# Patient Record
Sex: Male | Born: 1970 | Race: White | Hispanic: No | Marital: Married | Smoking: Never smoker
Health system: Southern US, Community
[De-identification: ages and names within clinical notes are randomized; demographics above are authoritative.]

## PROBLEM LIST (undated history)

## (undated) DIAGNOSIS — M1990 Unspecified osteoarthritis, unspecified site: Secondary | ICD-10-CM

## (undated) DIAGNOSIS — Z8701 Personal history of pneumonia (recurrent): Secondary | ICD-10-CM

## (undated) DIAGNOSIS — N289 Disorder of kidney and ureter, unspecified: Secondary | ICD-10-CM

## (undated) DIAGNOSIS — K219 Gastro-esophageal reflux disease without esophagitis: Secondary | ICD-10-CM

## (undated) HISTORY — PX: ADENOIDECTOMY: SHX5191

## (undated) HISTORY — PX: FOOT SURGERY: SHX648

---

## 2001-10-05 ENCOUNTER — Encounter: Payer: Self-pay | Admitting: Family Medicine

## 2001-10-05 ENCOUNTER — Ambulatory Visit (HOSPITAL_COMMUNITY): Admission: RE | Admit: 2001-10-05 | Discharge: 2001-10-05 | Payer: Self-pay | Admitting: Family Medicine

## 2007-02-11 ENCOUNTER — Ambulatory Visit (HOSPITAL_COMMUNITY): Admission: RE | Admit: 2007-02-11 | Discharge: 2007-02-11 | Payer: Self-pay | Admitting: Family Medicine

## 2008-05-15 ENCOUNTER — Ambulatory Visit (HOSPITAL_COMMUNITY): Admission: RE | Admit: 2008-05-15 | Discharge: 2008-05-15 | Payer: Self-pay | Admitting: Family Medicine

## 2009-12-29 ENCOUNTER — Ambulatory Visit: Payer: Self-pay | Admitting: Internal Medicine

## 2011-07-15 ENCOUNTER — Emergency Department (HOSPITAL_COMMUNITY): Payer: 59

## 2011-07-15 ENCOUNTER — Emergency Department (HOSPITAL_COMMUNITY)
Admission: EM | Admit: 2011-07-15 | Discharge: 2011-07-15 | Disposition: A | Discharge status: Home / Self Care | Payer: 59 | Attending: Emergency Medicine | Admitting: Emergency Medicine

## 2011-07-15 DIAGNOSIS — N200 Calculus of kidney: Secondary | ICD-10-CM | POA: Insufficient documentation

## 2011-07-15 DIAGNOSIS — N23 Unspecified renal colic: Secondary | ICD-10-CM

## 2011-07-15 LAB — URINE MICROSCOPIC-ADD ON

## 2011-07-15 LAB — URINALYSIS, ROUTINE W REFLEX MICROSCOPIC
Bilirubin Urine: NEGATIVE
Glucose, UA: NEGATIVE mg/dL
Ketones, ur: NEGATIVE mg/dL
Leukocytes, UA: NEGATIVE
Nitrite: NEGATIVE
Specific Gravity, Urine: >1.03 — ABNORMAL HIGH (ref 1.005–1.030)
Urobilinogen, UA: 0.2 mg/dL (ref 0.0–1.0)
pH: 5.5 (ref 5.0–8.0)

## 2011-07-15 MED ORDER — IBUPROFEN 800 MG PO TABS: 800.0000 mg | ORAL_TABLET | Freq: Three times a day (TID) | ORAL | Status: AC

## 2011-07-15 MED ORDER — OXYCODONE-ACETAMINOPHEN 5-325 MG PO TABS: ORAL_TABLET | ORAL | Status: DC

## 2011-07-15 MED ORDER — SODIUM CHLORIDE 0.9 % IV SOLN: Freq: Once | INTRAVENOUS | Status: DC

## 2011-07-15 NOTE — ED Notes (Signed)
Pt reports rt flank pain radiating around to his rt abd that began yesterday.  Pt denies any urinary symptoms but states that the pain has worsened.  Pt also reports some nausea and diaphoresis that began today.

## 2011-07-15 NOTE — ED Notes (Signed)
Pt given urine strainer and instructions to strain all urine.

## 2011-07-15 NOTE — ED Notes (Signed)
Pt c/o right flank pain off and on since yesterday. C/o nausea and "breaking out in a sweat" this am x 1. No c/o pain or nausea at this time. Family at bedside.

## 2011-07-15 NOTE — ED Provider Notes (Signed)
History     CSN: [id number] Arrival date & time: 07/15/2011  2:31 PM  Chief Complaint  Patient presents with  . Flank Pain   Patient is a 40 y.o. male presenting with flank pain. The history is provided by the patient.  Flank Pain This is a new problem. The current episode started yesterday. The problem has been gradually worsening. Associated symptoms include abdominal pain and nausea. Pertinent negatives include no arthralgias, change in bowel habit, chest pain, chills, coughing, fever, neck pain, rash, swollen glands or urinary symptoms. The symptoms are aggravated by nothing. He has tried NSAIDs for the symptoms. The treatment provided no relief.    History reviewed. No pertinent past medical history.  Past Surgical History  Procedure Date  . Foot surgery     No family history on file.  History  Substance Use Topics  . Smoking status: Never Smoker   . Smokeless tobacco: Not on file  . Alcohol Use: No      Review of Systems  Constitutional: Negative for fever, chills and activity change.       All ROS Neg except as noted in HPI  HENT: Negative for nosebleeds and neck pain.   Eyes: Negative for photophobia and discharge.  Respiratory: Negative for cough, shortness of breath and wheezing.   Cardiovascular: Negative for chest pain and palpitations.  Gastrointestinal: Positive for nausea and abdominal pain. Negative for blood in stool and change in bowel habit.  Genitourinary: Positive for flank pain. Negative for dysuria, frequency and hematuria.  Musculoskeletal: Negative for back pain and arthralgias.  Skin: Negative.  Negative for rash.  Neurological: Negative for dizziness, seizures and speech difficulty.  Psychiatric/Behavioral: Negative for hallucinations and confusion.    Physical Exam  BP 139/89  Pulse 86  Temp(Src) 98.6 F (37 C) (Oral)  Resp 18  Ht 6' (1.829 m)  Wt 203 lb (92.08 kg)  BMI 27.53 kg/m2  SpO2 98%  Physical Exam  Nursing note and vitals  reviewed. Constitutional: He is oriented to person, place, and time. He appears well-developed and well-nourished.  Non-toxic appearance.  HENT:  Head: Normocephalic.  Right Ear: Tympanic membrane and external ear normal.  Left Ear: Tympanic membrane and external ear normal.  Eyes: EOM and lids are normal. Pupils are equal, round, and reactive to light.  Neck: Normal range of motion. Neck supple. Carotid bruit is not present.  Cardiovascular: Normal rate, regular rhythm, normal heart sounds, intact distal pulses and normal pulses.   Pulmonary/Chest: Breath sounds normal. No respiratory distress.  Abdominal: Soft. Bowel sounds are normal. There is tenderness. There is no guarding.       Mild right CVAT  Musculoskeletal: Normal range of motion.  Lymphadenopathy:       Head (right side): No submandibular adenopathy present.       Head (left side): No submandibular adenopathy present.    He has no cervical adenopathy.  Neurological: He is alert and oriented to person, place, and time. He has normal strength. No cranial nerve deficit or sensory deficit.  Skin: Skin is warm and dry.  Psychiatric: He has a normal mood and affect. His speech is normal.    ED Course  Procedures  MDM Suspect renal colic with kidney stone. R/O UTI, atypical appendicitis I have reviewed nursing notes, vital signs, and all appropriate lab and imaging results for this patient. Results for orders placed during the hospital encounter of 07/15/11  URINALYSIS, ROUTINE W REFLEX MICROSCOPIC      Component  Value Range   Color, Urine YELLOW  YELLOW    APPearance CLEAR  CLEAR    Specific Gravity, Urine >1.030 (*) 1.005 - 1.030    pH 5.5  5.0 - 8.0    Glucose, UA NEGATIVE  NEGATIVE (mg/dL)   Hgb urine dipstick LARGE (*) NEGATIVE    Bilirubin Urine NEGATIVE  NEGATIVE    Ketones, ur NEGATIVE  NEGATIVE (mg/dL)   Protein, ur TRACE (*) NEGATIVE (mg/dL)   Urobilinogen, UA 0.2  0.0 - 1.0 (mg/dL)   Nitrite NEGATIVE   NEGATIVE    Leukocytes, UA NEGATIVE  NEGATIVE   URINE MICROSCOPIC-ADD ON      Component Value Range   Squamous Epithelial / LPF FEW (*) RARE    WBC, UA 3-6  <3 (WBC/hpf)   RBC / HPF TOO NUMEROUS TO COUNT  <3 (RBC/hpf)   Bacteria, UA FEW (*) RARE    Urine-Other MUCOUS PRESENT     No results found.     Kathie Dike, PA 07/15/11 [address] [place], [place] 01/05/12 [phone]

## 2011-07-15 NOTE — Progress Notes (Signed)
Test results given to pt. Plan discussed.

## 2011-07-15 NOTE — Progress Notes (Signed)
  Medical screening examination/treatment/procedure(s) were performed by non-physician practitioner and as supervising physician I was immediately available for consultation/collaboration.     

## 2012-01-07 NOTE — ED Provider Notes (Signed)
Medical screening examination/treatment/procedure(s) were performed by non-physician practitioner and as supervising physician I was immediately available for consultation/collaboration.  Juliet Rude. Rubin Payor, MD 01/07/12 [phone]

## 2013-04-03 ENCOUNTER — Encounter (HOSPITAL_COMMUNITY): Payer: Self-pay | Admitting: Emergency Medicine

## 2013-04-03 ENCOUNTER — Emergency Department (HOSPITAL_COMMUNITY)
Admission: EM | Admit: 2013-04-03 | Discharge: 2013-04-03 | Disposition: A | Discharge status: Home / Self Care | Payer: 59 | Attending: Emergency Medicine | Admitting: Emergency Medicine

## 2013-04-03 ENCOUNTER — Emergency Department (HOSPITAL_COMMUNITY): Payer: 59

## 2013-04-03 DIAGNOSIS — R110 Nausea: Secondary | ICD-10-CM | POA: Insufficient documentation

## 2013-04-03 DIAGNOSIS — N201 Calculus of ureter: Secondary | ICD-10-CM | POA: Insufficient documentation

## 2013-04-03 DIAGNOSIS — Z79899 Other long term (current) drug therapy: Secondary | ICD-10-CM | POA: Insufficient documentation

## 2013-04-03 DIAGNOSIS — N23 Unspecified renal colic: Secondary | ICD-10-CM

## 2013-04-03 LAB — BASIC METABOLIC PANEL
Calcium: 9.1 mg/dL (ref 8.4–10.5)
Creatinine, Ser: 1.01 mg/dL (ref 0.50–1.35)
GFR calc Af Amer: >90 mL/min (ref >90)

## 2013-04-03 LAB — CBC WITH DIFFERENTIAL/PLATELET
Basophils Absolute: 0 10*3/uL (ref 0.0–0.1)
Basophils Relative: 0 % (ref 0–1)
Eosinophils Absolute: 0.2 10*3/uL (ref 0.0–0.7)
Eosinophils Relative: 2 % (ref 0–5)
HCT: 41.9 % (ref 39.0–52.0)
MCH: 30.3 pg (ref 26.0–34.0)
MCHC: 35.3 g/dL (ref 30.0–36.0)
MCV: 85.7 fL (ref 78.0–100.0)
Monocytes Absolute: 0.9 10*3/uL (ref 0.1–1.0)
Neutro Abs: 6.5 10*3/uL (ref 1.7–7.7)
RDW: 13.2 % (ref 11.5–15.5)

## 2013-04-03 MED ORDER — SODIUM CHLORIDE 0.9 % IV SOLN: 1000.0000 mL | INTRAVENOUS | Status: DC

## 2013-04-03 MED ORDER — OXYCODONE-ACETAMINOPHEN 5-325 MG PO TABS: 1.0000 | ORAL_TABLET | ORAL | Status: DC | PRN

## 2013-04-03 MED ORDER — KETOROLAC TROMETHAMINE 30 MG/ML IJ SOLN
30.0000 mg | Freq: Once | INTRAMUSCULAR | Status: AC
  Administered 2013-04-03: 30 mg via INTRAVENOUS
  Filled 2013-04-03: qty 1

## 2013-04-03 MED ORDER — TAMSULOSIN HCL 0.4 MG PO CAPS: 0.4000 mg | ORAL_CAPSULE | Freq: Every day | ORAL | Status: DC

## 2013-04-03 MED ORDER — NAPROXEN 500 MG PO TABS: 500.0000 mg | ORAL_TABLET | Freq: Two times a day (BID) | ORAL | Status: DC

## 2013-04-03 MED ORDER — ONDANSETRON HCL 4 MG/2ML IJ SOLN
4.0000 mg | Freq: Once | INTRAMUSCULAR | Status: AC
  Administered 2013-04-03: 4 mg via INTRAVENOUS
  Filled 2013-04-03: qty 2

## 2013-04-03 MED ORDER — HYDROMORPHONE HCL PF 1 MG/ML IJ SOLN
1.0000 mg | Freq: Once | INTRAMUSCULAR | Status: AC
  Administered 2013-04-03: 1 mg via INTRAVENOUS
  Filled 2013-04-03: qty 1

## 2013-04-03 MED ORDER — SODIUM CHLORIDE 0.9 % IV SOLN
1000.0000 mL | Freq: Once | INTRAVENOUS | Status: AC
  Administered 2013-04-03: 1000 mL via INTRAVENOUS

## 2013-04-03 NOTE — ED Notes (Signed)
Pt presents with left sided flank pain, states he believes he has a kidney stone. States he has had one in the past and this feels the same, also notes nausea and vomiting, actively vomiting on assessment.

## 2013-04-03 NOTE — ED Notes (Signed)
Patient c/o left flank pain since 2315; states having nausea.

## 2013-04-03 NOTE — ED Provider Notes (Signed)
History     CSN: [id number]  Arrival date & time 04/03/13  0031   First MD Initiated Contact with Patient 04/03/13 0107      Chief Complaint  Patient presents with  . Flank Pain    (Consider location/radiation/quality/duration/timing/severity/associated sxs/prior treatment) Patient is a 42 y.o. male presenting with flank pain. The history is provided by the patient.  Flank Pain  He had onset at 2315 of severe left flank pain with radiation to left lower abdomen. Pain is a burning and he rates it at 9/10. There is associated nausea without vomiting. He denies fever, chills, sweats. Pain is similar to what he has had with kidney stones in the past. Nothing makes it better nothing makes it worse. He is unable to find a comfortable position. He denies any urinary difficulty.  History reviewed. No pertinent past medical history.  Past Surgical History  Procedure Laterality Date  . Foot surgery      No family history on file.  History  Substance Use Topics  . Smoking status: Never Smoker   . Smokeless tobacco: Not on file  . Alcohol Use: No      Review of Systems  Genitourinary: Positive for flank pain.  All other systems reviewed and are negative.    Allergies  Review of patient's allergies indicates no known allergies.  Home Medications   Current Outpatient Rx  Name  Route  Sig  Dispense  Refill  . ibuprofen (ADVIL,MOTRIN) 200 MG tablet   Oral   Take 800 mg by mouth as needed. For pain          . omeprazole (PRILOSEC OTC) 20 MG tablet   Oral   Take 20 mg by mouth daily.           Marland Kitchen oxyCODONE-acetaminophen (PERCOCET) 5-325 MG per tablet      1 or 2 po q4h prn pain, take with food   25 tablet   0     BP 137/91  Pulse 106  Temp(Src) 98.5 F (36.9 C) (Oral)  Resp 20  Ht 6' (1.829 m)  Wt 210 lb (95.255 kg)  BMI 28.47 kg/m2  SpO2 98%  Physical Exam  Nursing note and vitals reviewed.  42 year old male, who is pacing the room trying to find a  comfortable position, but it is in no acute distress. Vital signs are significant for tachycardia with heart rate 106, and hypertension with blood pressure 137/91. Oxygen saturation is 98%, which is normal. Head is normocephalic and atraumatic. PERRLA, EOMI. Oropharynx is clear. Neck is nontender and supple without adenopathy or JVD. Back is nontender with moderate left CVA tenderness. Lungs are clear without rales, wheezes, or rhonchi. Chest is nontender. Heart has regular rate and rhythm without murmur. Abdomen is soft, flat, with mild tenderness in the left lower quadrant without rebound or guarding. There are no masses or hepatosplenomegaly and peristalsis is hypoactive. Extremities have no cyanosis or edema, full range of motion is present. Skin is pale warm and dry without rash. Neurologic: Mental status is normal, cranial nerves are intact, there are no motor or sensory deficits.  ED Course  Procedures (including critical care time)  Results for orders placed during the hospital encounter of 04/03/13  CBC WITH DIFFERENTIAL      Result Value Range   WBC 10.0  4.0 - 10.5 K/uL   RBC 4.89  4.22 - 5.81 MIL/uL   Hemoglobin 14.8  13.0 - 17.0 g/dL   HCT 41.9  39.0 - 52.0 %   MCV 85.7  78.0 - 100.0 fL   MCH 30.3  26.0 - 34.0 pg   MCHC 35.3  30.0 - 36.0 g/dL   RDW 16.1  09.6 - 04.5 %   Platelets 214  150 - 400 K/uL   Neutrophils Relative 65  43 - 77 %   Neutro Abs 6.5  1.7 - 7.7 K/uL   Lymphocytes Relative 25  12 - 46 %   Lymphs Abs 2.5  0.7 - 4.0 K/uL   Monocytes Relative 9  3 - 12 %   Monocytes Absolute 0.9  0.1 - 1.0 K/uL   Eosinophils Relative 2  0 - 5 %   Eosinophils Absolute 0.2  0.0 - 0.7 K/uL   Basophils Relative 0  0 - 1 %   Basophils Absolute 0.0  0.0 - 0.1 K/uL  BASIC METABOLIC PANEL      Result Value Range   Sodium 136  135 - 145 mEq/L   Potassium 3.6  3.5 - 5.1 mEq/L   Chloride 101  96 - 112 mEq/L   CO2 24  19 - 32 mEq/L   Glucose, Bld 124 (*) 70 - 99 mg/dL   BUN  14  6 - 23 mg/dL   Creatinine, Ser 4.09  0.50 - 1.35 mg/dL   Calcium 9.1  8.4 - 81.1 mg/dL   GFR calc non Af Amer >90  >90 mL/min   GFR calc Af Amer >90  >90 mL/min   Ct Abdomen Pelvis Wo Contrast  04/03/2013  *RADIOLOGY REPORT*  Clinical Data: Left flank pain.  Nausea and vomiting.  CT ABDOMEN AND PELVIS WITHOUT CONTRAST  Technique:  Multidetector CT imaging of the abdomen and pelvis was performed following the standard protocol without intravenous contrast.  Comparison: CT abdomen and pelvis 07/15/2011.  Findings: The lung bases are clear.  No pleural or pericardial effusion.  There is moderate left hydronephrosis due to a punctate stone measuring approximately 0.3 cm in the left ureter at the level of L3-4.  The patient has a punctate nonobstructing stone in the lower pole of the right kidney.  No left renal stones are seen.  The liver, gallbladder, adrenal glands, spleen and pancreas appear normal.  There is some colonic diverticulosis without diverticulitis.  The stomach, small bowel and appendix appear normal.  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  IMPRESSION:  1.  Moderate left hydronephrosis due to a 0.3 cm mid left ureteral stone. 2.  Punctate nonobstructing stone lower pole right kidney. 3.  Mild sigmoid diverticulosis without diverticulitis. 4.  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   Original Report Authenticated By: Holley Dexter, M.D.     Images viewed by me.   1. Ureteral colic   2. Left ureteral calculus       MDM  Left flank pain most consistent with ureteral colic. Old records have been reviewed and he has been seen for a kidney stone in the past. Most recently was in 2012. He'll be given IV hydromorphone, IV ketorolac, and IV ondansetron. CT of the abdomen will be obtained to see how big his stone is and where it is located.  CT shows 3 mm calculus in the mid ureter  on the left. He feels much better after above noted treatment. He will be discharged with prescriptions for Percocet, naproxen, and tamsulosin. He is referred to urology for followup.  Dione Booze, MD 04/03/13 [phone]

## 2013-04-04 MED FILL — Oxycodone w/ Acetaminophen Tab 5-325 MG: ORAL | Qty: 6 | Status: AC

## 2015-03-02 ENCOUNTER — Other Ambulatory Visit (HOSPITAL_COMMUNITY): Payer: Self-pay | Admitting: Internal Medicine

## 2015-03-02 DIAGNOSIS — M5126 Other intervertebral disc displacement, lumbar region: Secondary | ICD-10-CM

## 2015-03-02 DIAGNOSIS — M545 Low back pain: Secondary | ICD-10-CM

## 2015-03-07 ENCOUNTER — Ambulatory Visit (HOSPITAL_COMMUNITY)
Admission: RE | Admit: 2015-03-07 | Discharge: 2015-03-07 | Disposition: A | Discharge status: Home / Self Care | Payer: 59 | Source: Ambulatory Visit | Attending: Internal Medicine | Admitting: Internal Medicine

## 2015-03-07 DIAGNOSIS — M5126 Other intervertebral disc displacement, lumbar region: Secondary | ICD-10-CM

## 2015-03-07 DIAGNOSIS — M545 Low back pain: Secondary | ICD-10-CM

## 2015-03-09 ENCOUNTER — Other Ambulatory Visit (HOSPITAL_COMMUNITY): Payer: Self-pay | Admitting: Internal Medicine

## 2015-03-09 DIAGNOSIS — IMO0002 Reserved for concepts with insufficient information to code with codable children: Secondary | ICD-10-CM

## 2015-03-12 ENCOUNTER — Ambulatory Visit (HOSPITAL_COMMUNITY)
Admission: RE | Admit: 2015-03-12 | Discharge: 2015-03-12 | Disposition: A | Discharge status: Home / Self Care | Payer: 59 | Source: Ambulatory Visit | Attending: Internal Medicine | Admitting: Internal Medicine

## 2015-03-12 DIAGNOSIS — M519 Unspecified thoracic, thoracolumbar and lumbosacral intervertebral disc disorder: Secondary | ICD-10-CM | POA: Diagnosis not present

## 2015-03-12 DIAGNOSIS — IMO0002 Reserved for concepts with insufficient information to code with codable children: Secondary | ICD-10-CM

## 2015-12-03 ENCOUNTER — Emergency Department (HOSPITAL_COMMUNITY): Payer: 59

## 2015-12-03 ENCOUNTER — Emergency Department (HOSPITAL_COMMUNITY)
Admission: EM | Admit: 2015-12-03 | Discharge: 2015-12-03 | Disposition: A | Discharge status: Home / Self Care | Payer: 59 | Attending: Emergency Medicine | Admitting: Emergency Medicine

## 2015-12-03 ENCOUNTER — Encounter (HOSPITAL_COMMUNITY): Payer: Self-pay | Admitting: *Deleted

## 2015-12-03 DIAGNOSIS — R030 Elevated blood-pressure reading, without diagnosis of hypertension: Secondary | ICD-10-CM | POA: Insufficient documentation

## 2015-12-03 DIAGNOSIS — N201 Calculus of ureter: Secondary | ICD-10-CM | POA: Diagnosis not present

## 2015-12-03 DIAGNOSIS — N23 Unspecified renal colic: Secondary | ICD-10-CM | POA: Diagnosis not present

## 2015-12-03 DIAGNOSIS — Z791 Long term (current) use of non-steroidal anti-inflammatories (NSAID): Secondary | ICD-10-CM | POA: Insufficient documentation

## 2015-12-03 DIAGNOSIS — R109 Unspecified abdominal pain: Secondary | ICD-10-CM | POA: Diagnosis present

## 2015-12-03 DIAGNOSIS — Z79899 Other long term (current) drug therapy: Secondary | ICD-10-CM | POA: Insufficient documentation

## 2015-12-03 HISTORY — DX: Disorder of kidney and ureter, unspecified: N28.9

## 2015-12-03 LAB — I-STAT CHEM 8, ED
BUN: 18 mg/dL (ref 6–20)
CALCIUM ION: 1.17 mmol/L (ref 1.12–1.23)
CREATININE: 1 mg/dL (ref 0.61–1.24)
Chloride: 104 mmol/L (ref 101–111)
GLUCOSE: 141 mg/dL — AB (ref 65–99)
HCT: 43 % (ref 39.0–52.0)
HEMOGLOBIN: 14.6 g/dL (ref 13.0–17.0)
Potassium: 3.7 mmol/L (ref 3.5–5.1)
Sodium: 141 mmol/L (ref 135–145)
TCO2: 25 mmol/L (ref 0–100)

## 2015-12-03 MED ORDER — ONDANSETRON HCL 4 MG/2ML IJ SOLN
4.0000 mg | Freq: Once | INTRAMUSCULAR | Status: AC
  Administered 2015-12-03: 4 mg via INTRAVENOUS
  Filled 2015-12-03: qty 2

## 2015-12-03 MED ORDER — OXYCODONE-ACETAMINOPHEN 5-325 MG PO TABS: 1.0000 | ORAL_TABLET | ORAL | Status: DC | PRN

## 2015-12-03 MED ORDER — ONDANSETRON HCL 4 MG/2ML IJ SOLN
INTRAMUSCULAR | Status: AC
  Filled 2015-12-03: qty 2

## 2015-12-03 MED ORDER — HYDROMORPHONE HCL 1 MG/ML IJ SOLN
1.0000 mg | Freq: Once | INTRAMUSCULAR | Status: AC
  Administered 2015-12-03: 1 mg via INTRAVENOUS
  Filled 2015-12-03: qty 1

## 2015-12-03 MED ORDER — TAMSULOSIN HCL 0.4 MG PO CAPS: ORAL_CAPSULE | ORAL | Status: DC

## 2015-12-03 MED ORDER — SODIUM CHLORIDE 0.9 % IV SOLN
INTRAVENOUS | Status: DC
  Administered 2015-12-03: 04:00:00 via INTRAVENOUS

## 2015-12-03 MED ORDER — KETOROLAC TROMETHAMINE 30 MG/ML IJ SOLN
30.0000 mg | Freq: Once | INTRAMUSCULAR | Status: AC
  Administered 2015-12-03: 30 mg via INTRAVENOUS
  Filled 2015-12-03: qty 1

## 2015-12-03 MED ORDER — ONDANSETRON 4 MG PO TBDP: 4.0000 mg | ORAL_TABLET | Freq: Three times a day (TID) | ORAL | Status: DC | PRN

## 2015-12-03 MED ORDER — ONDANSETRON HCL 4 MG/2ML IJ SOLN
4.0000 mg | Freq: Once | INTRAMUSCULAR | Status: AC
  Administered 2015-12-03: 4 mg via INTRAVENOUS

## 2015-12-03 NOTE — ED Notes (Signed)
Pt c/o right flank pain that radiates around to groin; pt states the pain has gotten worse over the last couple of days

## 2015-12-03 NOTE — ED Provider Notes (Signed)
CSN: [id number]     Arrival date & time 12/03/15  4098 History   First MD Initiated Contact with Patient 12/03/15 0400    Chief Complaint  Patient presents with  . Flank Pain     (Consider location/radiation/quality/duration/timing/severity/associated sxs/prior Treatment) HPI patient reports for the past 6 or 7 weeks he's been having some intermittent right-sided flank pain that radiates around to his right flank. He states a week ago it lasted about 7 hours but otherwise the pain is variable. Nothing he does makes the pain come on or get worse. Nothing he does makes the pain feel better. He states about 1 AM this morning the pain got a lot worse. He states it's in his right flank in it's in his right lateral abdomen now. He has had nausea without vomiting or hematuria. He denies significant milk or caffeine ingestion. He states he does not drink well water he only drinks bottled water. He has had kidney stones in the past which he is passed without intervention.  PCP Dr Sherwood Gambler  Past Medical History  Diagnosis Date  . Renal disorder     kidney stones   Past Surgical History  Procedure Laterality Date  . Foot surgery    . Adenoidectomy     History reviewed. No pertinent family history. Social History  Substance Use Topics  . Smoking status: Never Smoker   . Smokeless tobacco: None  . Alcohol Use: No    Review of Systems  All other systems reviewed and are negative.     Allergies  Review of patient's allergies indicates no known allergies.  Home Medications   Prior to Admission medications   Medication Sig Start Date End Date Taking? Authorizing Provider  ibuprofen (ADVIL,MOTRIN) 200 MG tablet Take 800 mg by mouth as needed. For pain     Historical Provider, MD  naproxen (NAPROSYN) 500 MG tablet Take 1 tablet (500 mg total) by mouth 2 (two) times daily. 04/03/13   Dione Booze, MD  omeprazole (PRILOSEC OTC) 20 MG tablet Take 20 mg by mouth daily.      Historical Provider, MD    ondansetron (ZOFRAN ODT) 4 MG disintegrating tablet Take 1 tablet (4 mg total) by mouth every 8 (eight) hours as needed for nausea or vomiting. 12/03/15   Devoria Albe, MD  oxyCODONE-acetaminophen (PERCOCET/ROXICET) 5-325 MG tablet Take 1-2 tablets by mouth every 4 (four) hours as needed for severe pain. 12/03/15   Devoria Albe, MD  tamsulosin (FLOMAX) 0.4 MG CAPS capsule Take 1 po QD until you pass the stone. 12/03/15   Devoria Albe, MD   BP 143/101 mmHg  Pulse 80  Temp(Src) 98.4 F (36.9 C) (Oral)  Resp 20  Ht 6' (1.829 m)  Wt 205 lb (92.987 kg)  BMI 27.80 kg/m2  SpO2 96%  Vital signs normal except hypertension  Physical Exam  Constitutional: He is oriented to person, place, and time. He appears well-developed and well-nourished.  Non-toxic appearance. He does not appear ill. He appears distressed.  HENT:  Head: Normocephalic and atraumatic.  Right Ear: External ear normal.  Left Ear: External ear normal.  Nose: Nose normal. No mucosal edema or rhinorrhea.  Mouth/Throat: Oropharynx is clear and moist and mucous membranes are normal. No dental abscesses or uvula swelling.  Eyes: Conjunctivae and EOM are normal. Pupils are equal, round, and reactive to light.  Neck: Normal range of motion and full passive range of motion without pain. Neck supple.  Cardiovascular: Normal rate, regular rhythm and normal  heart sounds.  Exam reveals no gallop and no friction rub.   No murmur heard. Pulmonary/Chest: Effort normal and breath sounds normal. No respiratory distress. He has no wheezes. He has no rhonchi. He has no rales. He exhibits no tenderness and no crepitus.  Abdominal: Soft. Normal appearance and bowel sounds are normal. He exhibits no distension. There is no tenderness. There is no rebound and no guarding.    Area of pain noted  Musculoskeletal: Normal range of motion. He exhibits no edema or tenderness.       Back:  Area of pain noted. Moves all extremities well.   Neurological: He is alert  and oriented to person, place, and time. He has normal strength. No cranial nerve deficit.  Skin: Skin is warm, dry and intact. No rash noted. No erythema. There is pallor.  Psychiatric: He has a normal mood and affect. His speech is normal and behavior is normal. His mood appears not anxious.  Nursing note and vitals reviewed.   ED Course  Procedures (including critical care time)  Medications  0.9 %  sodium chloride infusion ( Intravenous New Bag/Given 12/03/15 0420)  HYDROmorphone (DILAUDID) injection 1 mg (1 mg Intravenous Given 12/03/15 0420)  ondansetron (ZOFRAN) injection 4 mg (4 mg Intravenous Given 12/03/15 0420)  ketorolac (TORADOL) 30 MG/ML injection 30 mg (30 mg Intravenous Given 12/03/15 0502)  ondansetron (ZOFRAN) injection 4 mg (4 mg Intravenous Given 12/03/15 0506)   Patient was given IV Dilaudid and Zofran for presumed kidney stone based on his prior history and the description of his pain.  5 AM I reviewed patient's CT scan and he was noted to have a stone in the proximal ureter. He also had a intrarenal stone. He was given Toradol for continued discomfort although he states it is much better than it had been when he came in. He also was still having some nausea and the Zofran was repeated. We reviewed his CT scan.  Recheck at 6 AM. Patient states his pain is gone. His nausea is gone. We discussed followed up with the urologist on-call. We discussed reasons to return to the ED such as fever, or uncontrolled vomiting or pain.    Labs Review Results for orders placed or performed during the hospital encounter of 12/03/15  I-stat Chem 8, ED  Result Value Ref Range   Sodium 141 135 - 145 mmol/L   Potassium 3.7 3.5 - 5.1 mmol/L   Chloride 104 101 - 111 mmol/L   BUN 18 6 - 20 mg/dL   Creatinine, Ser 1.611.00 0.61 - 1.24 mg/dL   Glucose, Bld 096141 (H) 65 - 99 mg/dL   Calcium, Ion 0.451.17 4.091.12 - 1.23 mmol/L   TCO2 25 0 - 100 mmol/L   Hemoglobin 14.6 13.0 - 17.0 g/dL   HCT 81.143.0 91.439.0 - 78.252.0  %    Laboratory interpretation all normal   MDM   Final diagnoses:  Left ureteral stone  Renal colic on right side    New Prescriptions   ONDANSETRON (ZOFRAN ODT) 4 MG DISINTEGRATING TABLET    Take 1 tablet (4 mg total) by mouth every 8 (eight) hours as needed for nausea or vomiting.   OXYCODONE-ACETAMINOPHEN (PERCOCET/ROXICET) 5-325 MG TABLET    Take 1-2 tablets by mouth every 4 (four) hours as needed for severe pain.   TAMSULOSIN (FLOMAX) 0.4 MG CAPS CAPSULE    Take 1 po QD until you pass the stone.    Plan discharge  Devoria AlbeIva Emaleigh Guimond, MD, Armando GangFACEP  Devoria Albe, MD 12/03/15 [phone]

## 2015-12-03 NOTE — Discharge Instructions (Signed)
Drink plenty of fluids. Take the medications as prescribed. Return to the ED if you get fever or have uncontrolled vomiting or pain. Call Dr Belva CromeWrenn's office (the Urologist on call tonight) to get a follow up appointment. You are passing a 5 mm stone that is in the right UPJ and you also have a 7 mm stone still inside the right kidney.    Dietary Guidelines to Help Prevent Kidney Stones Your risk of kidney stones can be decreased by adjusting the foods you eat. The most important thing you can do is drink enough fluid. You should drink enough fluid to keep your urine clear or pale yellow. The following guidelines provide specific information for the type of kidney stone you have had. GUIDELINES ACCORDING TO TYPE OF KIDNEY STONE Calcium Oxalate Kidney Stones  Reduce the amount of salt you eat. Foods that have a lot of salt cause your body to release excess calcium into your urine. The excess calcium can combine with a substance called oxalate to form kidney stones.  Reduce the amount of animal protein you eat if the amount you eat is excessive. Animal protein causes your body to release excess calcium into your urine. Ask your dietitian how much protein from animal sources you should be eating.  Avoid foods that are high in oxalates. If you take vitamins, they should have less than 500 mg of vitamin C. Your body turns vitamin C into oxalates. You do not need to avoid fruits and vegetables high in vitamin C. Calcium Phosphate Kidney Stones  Reduce the amount of salt you eat to help prevent the release of excess calcium into your urine.  Reduce the amount of animal protein you eat if the amount you eat is excessive. Animal protein causes your body to release excess calcium into your urine. Ask your dietitian how much protein from animal sources you should be eating.  Get enough calcium from food or take a calcium supplement (ask your dietitian for recommendations). Food sources of calcium that do not  increase your risk of kidney stones include:  Broccoli.  Dairy products, such as cheese and yogurt.  Pudding. Uric Acid Kidney Stones  Do not have more than 6 oz of animal protein per day. FOOD SOURCES Animal Protein Sources  Meat (all types).  Poultry.  Eggs.  Fish, seafood. Foods High in MirantSalt  Salt seasonings.  Soy sauce.  Teriyaki sauce.  Cured and processed meats.  Salted crackers and snack foods.  Fast food.  Canned soups and most canned foods. Foods High in Oxalates  Grains:  Amaranth.  Barley.  Grits.  Wheat germ.  Bran.  Buckwheat flour.  All bran cereals.  Pretzels.  Whole wheat bread.  Vegetables:  Beans (wax).  Beets and beet greens.  Collard greens.  Eggplant.  Escarole.  Leeks.  Okra.  Parsley.  Rutabagas.  Spinach.  Swiss chard.  Tomato paste.  Fried potatoes.  Sweet potatoes.  Fruits:  Red currants.  Figs.  Kiwi.  Rhubarb.  Meat and Other Protein Sources:  Beans (dried).  Soy burgers and other soybean products.  Miso.  Nuts (peanuts, almonds, pecans, cashews, hazelnuts).  Nut butters.  Sesame seeds and tahini (paste made of sesame seeds).  Poppy seeds.  Beverages:  Chocolate drink mixes.  Soy milk.  Instant iced tea.  Juices made from high-oxalate fruits or vegetables.  Other:  Carob.  Chocolate.  Fruitcake.  Marmalades.   This information is not intended to replace advice given to you by  your health care provider. Make sure you discuss any questions you have with your health care provider.   Document Released: 03/07/2011 Document Revised: 11/15/2013 Document Reviewed: 10/07/2013 Elsevier Interactive Patient Education 2016 Else[employer] Inc[employer]  Kidney Stones Kidney stones (urolithiasis) are deposits that form inside your kidneys. The intense pain is caused by the stone moving through the urinary tract. When the stone moves, the ureter goes into spasm around the stone.  The stone is usually passed in the urine.  CAUSES   A disorder that makes certain neck glands produce too much parathyroid hormone (primary hyperparathyroidism).  A buildup of uric acid crystals, similar to gout in your joints.  Narrowing (stricture) of the ureter.  A kidney obstruction present at birth (congenital obstruction).  Previous surgery on the kidney or ureters.  Numerous kidney infections. SYMPTOMS   Feeling sick to your stomach (nauseous).  Throwing up (vomiting).  Blood in the urine (hematuria).  Pain that usually spreads (radiates) to the groin.  Frequency or urgency of urination. DIAGNOSIS   Taking a history and physical exam.  Blood or urine tests.  CT scan.  Occasionally, an examination of the inside of the urinary bladder (cystoscopy) is performed. TREATMENT   Observation.  Increasing your fluid intake.  Extracorporeal shock wave lithotripsy--This is a noninvasive procedure that uses shock waves to break up kidney stones.  Surgery may be needed if you have severe pain or persistent obstruction. There are various surgical procedures. Most of the procedures are performed with the use of small instruments. Only small incisions are needed to accommodate these instruments, so recovery time is minimized. The size, location, and chemical composition are all important variables that will determine the proper choice of action for you. Talk to your health care provider to better understand your situation so that you will minimize the risk of injury to yourself and your kidney.  HOME CARE INSTRUCTIONS   Drink enough water and fluids to keep your urine clear or pale yellow. This will help you to pass the stone or stone fragments.  Strain all urine through the provided strainer. Keep all particulate matter and stones for your health care provider to see. The stone causing the pain may be as small as a grain of salt. It is very important to use the strainer each  and every time you pass your urine. The collection of your stone will allow your health care provider to analyze it and verify that a stone has actually passed. The stone analysis will often identify what you can do to reduce the incidence of recurrences.  Only take over-the-counter or prescription medicines for pain, discomfort, or fever as directed by your health care provider.  Keep all follow-up visits as told by your health care provider. This is important.  Get follow-up X-rays if required. The absence of pain does not always mean that the stone has passed. It may have only stopped moving. If the urine remains completely obstructed, it can cause loss of kidney function or even complete destruction of the kidney. It is your responsibility to make sure X-rays and follow-ups are completed. Ultrasounds of the kidney can show blockages and the status of the kidney. Ultrasounds are not associated with any radiation and can be performed easily in a matter of minutes.  Make changes to your daily diet as told by your health care provider. You may be told to:  Limit the amount of salt that you eat.  Eat 5 or more servings of fruits and vegetables  each day.  Limit the amount of meat, poultry, fish, and eggs that you eat.  Collect a 24-hour urine sample as told by your health care provider.You may need to collect another urine sample every 6-12 months. SEEK MEDICAL CARE IF:  You experience pain that is progressive and unresponsive to any pain medicine you have been prescribed. SEEK IMMEDIATE MEDICAL CARE IF:   Pain cannot be controlled with the prescribed medicine.  You have a fever or shaking chills.  The severity or intensity of pain increases over 18 hours and is not relieved by pain medicine.  You develop a new onset of abdominal pain.  You feel faint or pass out.  You are unable to urinate.   This information is not intended to replace advice given to you by your health care  provider. Make sure you discuss any questions you have with your health care provider.   Document Released: 11/10/2005 Document Revised: 08/01/2015 Document Reviewed: 04/13/2013 Elsevier Interactive Patient Education 2016 Elsevier Inc.  Renal Colic Renal colic is pain that is caused by a kidney stone. HOME CARE  Take medicines only as told by your doctor.  Ask your doctor if it is okay to take over-the-counter medicine for pain.  Drink enough fluid to keep your pee (urine) clear or pale yellow. Drink 6-8 glasses of water each day.  Eat less than 2 grams of salt per day.  Eat less protein. Some foods that have protein are meats, fish, nuts, and dairy.  Try not to eat spinach, rhubarb, nuts, or bran. GET HELP IF:  You have a fever or chills.  Your pee smells bad or looks cloudy.  You have pain or burning when you pee. GET HELP RIGHT AWAY IF:  The pain in your side (flank) or your groin suddenly gets worse.  You get confused.  You pass out.   This information is not intended to replace advice given to you by your health care provider. Make sure you discuss any questions you have with your health care provider.   Document Released: 04/28/2008 Document Revised: 12/01/2014 Document Reviewed: 09/20/2014 Elsevier Interactive Patient Education [employer]

## 2015-12-05 ENCOUNTER — Ambulatory Visit (INDEPENDENT_AMBULATORY_CARE_PROVIDER_SITE_OTHER): Payer: 59 | Admitting: Urology

## 2015-12-05 DIAGNOSIS — N200 Calculus of kidney: Secondary | ICD-10-CM

## 2015-12-05 DIAGNOSIS — N201 Calculus of ureter: Secondary | ICD-10-CM

## 2015-12-26 ENCOUNTER — Other Ambulatory Visit: Payer: Self-pay | Admitting: Urology

## 2015-12-26 ENCOUNTER — Ambulatory Visit (HOSPITAL_COMMUNITY)
Admission: RE | Admit: 2015-12-26 | Discharge: 2015-12-26 | Disposition: A | Discharge status: Home / Self Care | Payer: 59 | Source: Ambulatory Visit | Attending: Urology | Admitting: Urology

## 2015-12-26 ENCOUNTER — Ambulatory Visit (INDEPENDENT_AMBULATORY_CARE_PROVIDER_SITE_OTHER): Payer: 59 | Admitting: Urology

## 2015-12-26 DIAGNOSIS — N200 Calculus of kidney: Secondary | ICD-10-CM

## 2015-12-26 DIAGNOSIS — N201 Calculus of ureter: Secondary | ICD-10-CM

## 2016-03-19 ENCOUNTER — Ambulatory Visit (HOSPITAL_COMMUNITY): Admission: RE | Admit: 2016-03-19 | Payer: 59 | Source: Ambulatory Visit

## 2016-03-26 ENCOUNTER — Ambulatory Visit (INDEPENDENT_AMBULATORY_CARE_PROVIDER_SITE_OTHER): Payer: 59 | Admitting: Urology

## 2016-03-26 DIAGNOSIS — N201 Calculus of ureter: Secondary | ICD-10-CM | POA: Diagnosis not present

## 2016-03-26 DIAGNOSIS — N200 Calculus of kidney: Secondary | ICD-10-CM | POA: Diagnosis not present

## 2016-03-27 ENCOUNTER — Other Ambulatory Visit: Payer: Self-pay | Admitting: Urology

## 2016-03-27 ENCOUNTER — Ambulatory Visit (HOSPITAL_COMMUNITY)
Admission: RE | Admit: 2016-03-27 | Discharge: 2016-03-27 | Disposition: A | Discharge status: Home / Self Care | Payer: 59 | Source: Ambulatory Visit | Attending: Urology | Admitting: Urology

## 2016-03-27 DIAGNOSIS — N200 Calculus of kidney: Secondary | ICD-10-CM | POA: Diagnosis not present

## 2016-03-31 ENCOUNTER — Other Ambulatory Visit: Payer: Self-pay | Admitting: Urology

## 2016-03-31 DIAGNOSIS — N200 Calculus of kidney: Secondary | ICD-10-CM

## 2016-04-02 ENCOUNTER — Ambulatory Visit (HOSPITAL_COMMUNITY): Admission: RE | Admit: 2016-04-02 | Payer: 59 | Source: Ambulatory Visit

## 2016-04-23 ENCOUNTER — Ambulatory Visit (HOSPITAL_COMMUNITY)
Admission: RE | Admit: 2016-04-23 | Discharge: 2016-04-23 | Disposition: A | Discharge status: Home / Self Care | Payer: 59 | Source: Ambulatory Visit | Attending: Urology | Admitting: Urology

## 2016-04-23 ENCOUNTER — Other Ambulatory Visit: Payer: Self-pay | Admitting: Urology

## 2016-04-23 ENCOUNTER — Ambulatory Visit (INDEPENDENT_AMBULATORY_CARE_PROVIDER_SITE_OTHER): Payer: 59 | Admitting: Urology

## 2016-04-23 ENCOUNTER — Encounter (HOSPITAL_COMMUNITY): Payer: Self-pay | Admitting: *Deleted

## 2016-04-23 DIAGNOSIS — N200 Calculus of kidney: Secondary | ICD-10-CM | POA: Insufficient documentation

## 2016-04-24 ENCOUNTER — Encounter (HOSPITAL_COMMUNITY): Payer: Self-pay | Admitting: *Deleted

## 2016-04-24 ENCOUNTER — Encounter (HOSPITAL_COMMUNITY)
Admission: RE | Disposition: A | Discharge status: Home / Self Care | Payer: Self-pay | Source: Ambulatory Visit | Attending: Urology

## 2016-04-24 ENCOUNTER — Ambulatory Visit (HOSPITAL_COMMUNITY): Payer: 59

## 2016-04-24 ENCOUNTER — Ambulatory Visit (HOSPITAL_COMMUNITY)
Admission: RE | Admit: 2016-04-24 | Discharge: 2016-04-24 | Disposition: A | Discharge status: Home / Self Care | Payer: 59 | Source: Ambulatory Visit | Attending: Urology | Admitting: Urology

## 2016-04-24 DIAGNOSIS — Z87442 Personal history of urinary calculi: Secondary | ICD-10-CM | POA: Insufficient documentation

## 2016-04-24 DIAGNOSIS — M1990 Unspecified osteoarthritis, unspecified site: Secondary | ICD-10-CM | POA: Insufficient documentation

## 2016-04-24 DIAGNOSIS — N200 Calculus of kidney: Secondary | ICD-10-CM | POA: Insufficient documentation

## 2016-04-24 DIAGNOSIS — R109 Unspecified abdominal pain: Secondary | ICD-10-CM | POA: Diagnosis present

## 2016-04-24 HISTORY — DX: Personal history of pneumonia (recurrent): Z87.01

## 2016-04-24 HISTORY — DX: Gastro-esophageal reflux disease without esophagitis: K21.9

## 2016-04-24 HISTORY — DX: Unspecified osteoarthritis, unspecified site: M19.90

## 2016-04-24 SURGERY — LITHOTRIPSY, ESWL
Anesthesia: LOCAL | Laterality: Right

## 2016-04-24 MED ORDER — SODIUM CHLORIDE 0.9 % IV SOLN
INTRAVENOUS | Status: DC
  Administered 2016-04-24: 07:00:00 via INTRAVENOUS

## 2016-04-24 MED ORDER — DIAZEPAM 5 MG PO TABS
10.0000 mg | ORAL_TABLET | ORAL | Status: AC
  Administered 2016-04-24: 10 mg via ORAL
  Filled 2016-04-24: qty 2

## 2016-04-24 MED ORDER — DIPHENHYDRAMINE HCL 25 MG PO CAPS
25.0000 mg | ORAL_CAPSULE | ORAL | Status: AC
  Administered 2016-04-24: 25 mg via ORAL
  Filled 2016-04-24: qty 1

## 2016-04-24 MED ORDER — ONDANSETRON 4 MG PO TBDP: 4.0000 mg | ORAL_TABLET | Freq: Three times a day (TID) | ORAL | Status: DC | PRN

## 2016-04-24 MED ORDER — CIPROFLOXACIN HCL 500 MG PO TABS
500.0000 mg | ORAL_TABLET | ORAL | Status: AC
  Administered 2016-04-24: 500 mg via ORAL
  Filled 2016-04-24 (×2): qty 1

## 2016-04-24 NOTE — H&P (Signed)
Urology Admission H&P  Chief Complaint: right flank pain  History of Present Illness: Mr Langston MaskerMorris is a 45yo with a hx of nephrolithiasis who developed worsening right flank pain. He underwent CT which showed a 1cm right renal pelvis calculus.   Past Medical History  Diagnosis Date  . Renal disorder     kidney stones  . History of pneumonia   . GERD (gastroesophageal reflux disease)   . Arthritis    Past Surgical History  Procedure Laterality Date  . Foot surgery    . Adenoidectomy      Home Medications:  No prescriptions prior to admission   Allergies: No Known Allergies  History reviewed. No pertinent family history. Social History:  reports that he has never smoked. He has never used smokeless tobacco. He reports that he does not drink alcohol or use illicit drugs.  Review of Systems  Gastrointestinal: Positive for nausea.  Genitourinary: Positive for flank pain.  Neurological: Positive for weakness.  All other systems reviewed and are negative.   Physical Exam:  Vital signs in last 24 hours: Temp:  [97.5 F (36.4 C)-98.6 F (37 C)] 97.5 F (36.4 C) (06/01 0925) Pulse Rate:  [55-98] 55 (06/01 0925) Resp:  [12-18] 12 (06/01 0925) BP: (110-127)/(84-97) 127/90 mmHg (06/01 0925) SpO2:  [96 %-98 %] 96 % (06/01 0925) Weight:  [94.915 kg (209 lb 4 oz)-95.255 kg (210 lb)] 94.915 kg (209 lb 4 oz) (06/01 16100620) Physical Exam  Constitutional: He is oriented to person, place, and time. He appears well-developed and well-nourished.  HENT:  Head: Normocephalic and atraumatic.  Eyes: EOM are normal. Pupils are equal, round, and reactive to light.  Neck: Normal range of motion. No thyromegaly present.  Cardiovascular: Normal rate and regular rhythm.   Respiratory: Effort normal. No respiratory distress.  GI: Soft. He exhibits no distension.  Musculoskeletal: Normal range of motion.  Neurological: He is alert and oriented to person, place, and time.  Skin: Skin is warm and dry.   Psychiatric: He has a normal mood and affect. His behavior is normal. Judgment and thought content normal.    Laboratory Data:  No results found for this or any previous visit (from the past 24 hour(s)). No results found for this or any previous visit (from the past 240 hour(s)). Creatinine: No results for input(s): CREATININE in the last 168 hours. Baseline Creatinine: unknown  Impression/Assessment:  45yo with right renal calculus  Plan:  The risks/benefits/alternatives to ESWL was explained to the patient and they understand and wish to proceed with surgery  Wilkie AyePatrick Dasani Thurlow 04/24/2016, 11:32 AM

## 2016-04-24 NOTE — Discharge Instructions (Signed)
See also instructions from [facility]   Lithotripsy, Care After Refer to this sheet in the next few weeks. These instructions provide you with information on caring for yourself after your procedure. Your health care provider may also give you more specific instructions. Your treatment has been planned according to current medical practices, but problems sometimes occur. Call your health care provider if you have any problems or questions after your procedure. WHAT TO EXPECT AFTER THE PROCEDURE   Your urine may have a red tinge for a few days after treatment. Blood loss is usually minimal.  You may have soreness in the back or flank area. This usually goes away after a few days. The procedure can cause blotches or bruises on the back where the pressure wave enters the skin. These marks usually cause only minimal discomfort and should disappear in a short time.  Stone fragments should begin to pass within 24 hours of treatment. However, a delayed passage is not unusual.  You may have pain, discomfort, and feel sick to your stomach (nauseated) when the crushed fragments of stone are passed down the tube from the kidney to the bladder. Stone fragments can pass soon after the procedure and may last for up to 4-8 weeks.  A small number of patients may have severe pain when stone fragments are not able to pass, which leads to an obstruction.  If your stone is greater than 1 inch (2.5 cm) in diameter or if you have multiple stones that have a combined diameter greater than 1 inch (2.5 cm), you may require more than one treatment.  If you had a stent placed prior to your procedure, you may experience some discomfort, especially during urination. You may experience the pain or discomfort in your flank or back, or you may experience a sharp pain or discomfort at the base of your penis or in your lower abdomen. The discomfort usually lasts only a few minutes after urinating. HOME CARE  INSTRUCTIONS   Rest at home until you feel your energy improving.  Only take over-the-counter or prescription medicines for pain, discomfort, or fever as directed by your health care provider. Depending on the type of lithotripsy, you may need to take antibiotics and anti-inflammatory medicines for a few days.  Drink enough water and fluids to keep your urine clear or pale yellow. This helps "flush" your kidneys. It helps pass any remaining pieces of stone and prevents stones from coming back.  Most people can resume daily activities within 1-2 days after standard lithotripsy. It can take longer to recover from laser and percutaneous lithotripsy.  Strain all urine through the provided strainer. Keep all particulate matter and stones for your health care provider to see. The stone may be as small as a grain of salt. It is very important to use the strainer each and every time you pass your urine. Any stones that are found can be sent to a medical lab for examination.  Visit your health care provider for a follow-up appointment in a few weeks. Your doctor may remove your stent if you have one. Your health care provider will also check to see whether stone particles still remain. SEEK MEDICAL CARE IF:   Your pain is not relieved by medicine.  You have a lasting nauseous feeling.  You feel there is too much blood in the urine.  You develop persistent problems with frequent or painful urination that does not at least partially improve after 2 days following the procedure.  You have a congested cough.  You feel lightheaded.  You develop a rash or any other signs that might suggest an allergic problem.  You develop any reaction or side effects to your medicine(s). SEEK IMMEDIATE MEDICAL CARE IF:   You experience severe back or flank pain or both.  You see nothing but blood when you urinate.  You cannot pass any urine at all.  You have a fever or shaking chills.  You develop shortness  of breath, difficulty breathing, or chest pain.  You develop vomiting that will not stop after 6-8 hours.  You have a fainting episode.   This information is not intended to replace advice given to you by your health care provider. Make sure you discuss any questions you have with your health care provider.   Document Released: 11/30/2007 Document Revised: 08/01/2015 Document Reviewed: 05/26/2013 Elsevier Interactive Patient Education [employer].

## 2016-07-02 ENCOUNTER — Ambulatory Visit: Payer: 59 | Admitting: Urology

## 2017-06-22 IMAGING — CT CT RENAL STONE PROTOCOL
2 of 4 series · 16 of 46 positions shown, 18 images · non-contrast
Comparison: CT dated 03/12/2015 and 04/03/2013

CLINICAL DATA: 44-year-old male with right flank pain.

EXAM:
CT ABDOMEN AND PELVIS WITHOUT CONTRAST
TECHNIQUE: Multidetector CT imaging of the abdomen and pelvis was performed
following the standard protocol without IV contrast.

[Series 2: standard/full over (age)lbs 5.0 · axial · 0.67mm/px · z∈[+534,+994]mm · 13 of 102 slices shown, 15 images]
[im 5/102  soft-tissue]
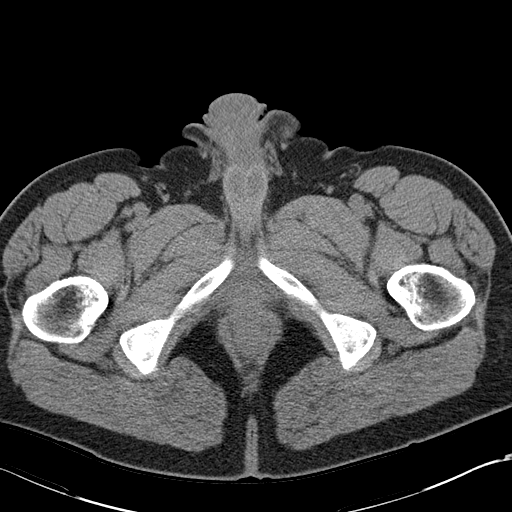
[im 5/102  bone]
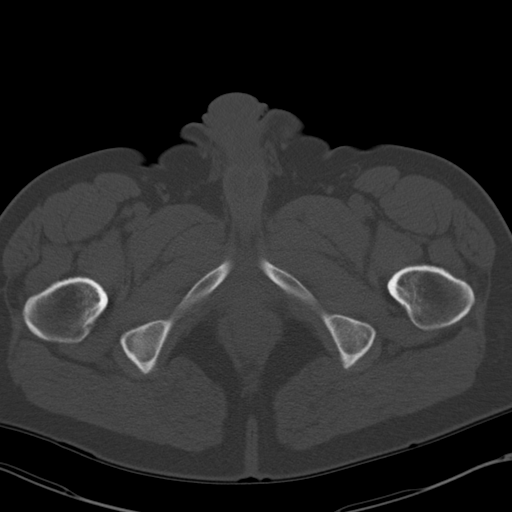
[im 14/102  soft-tissue]
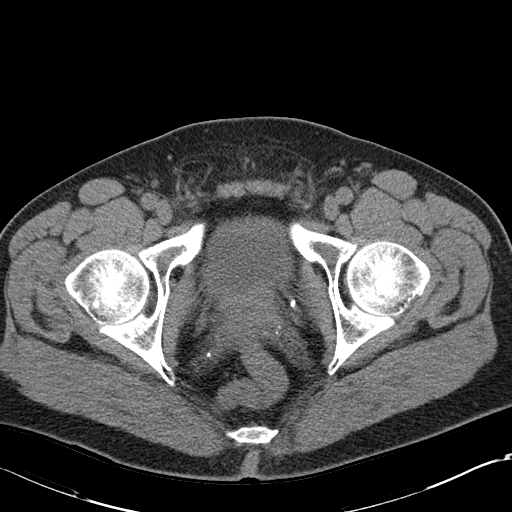
[im 22/102  soft-tissue]
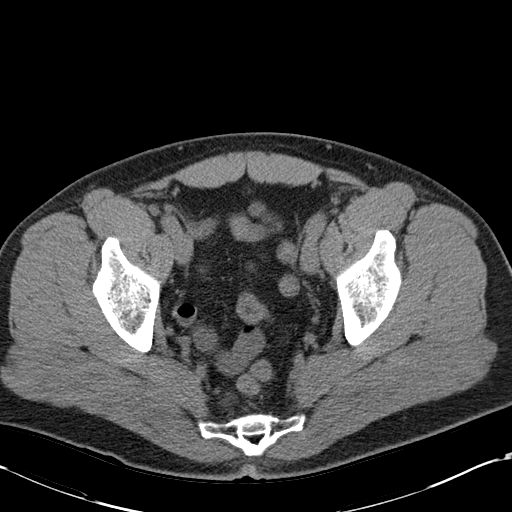
[im 27/102  soft-tissue]
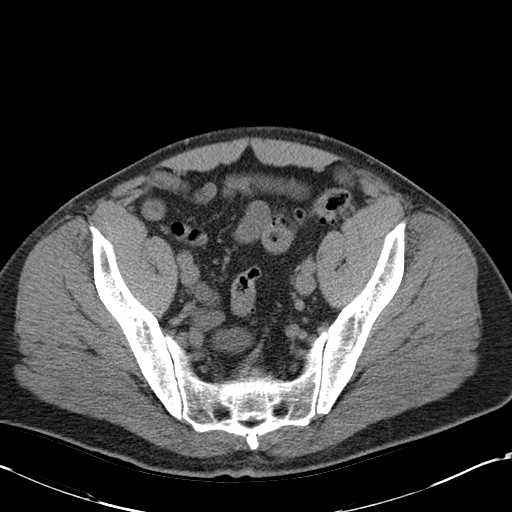
[im 36/102  soft-tissue]
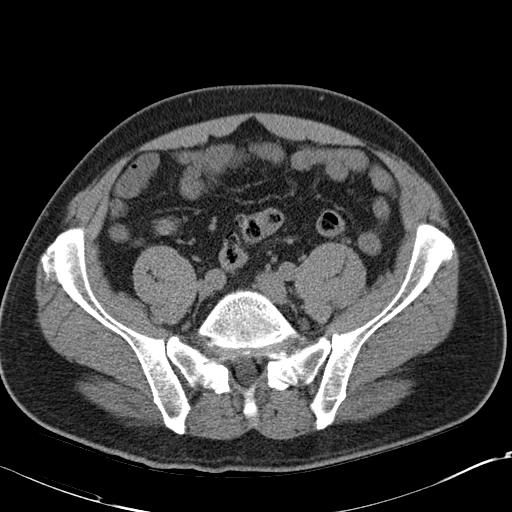
[im 44/102  soft-tissue]
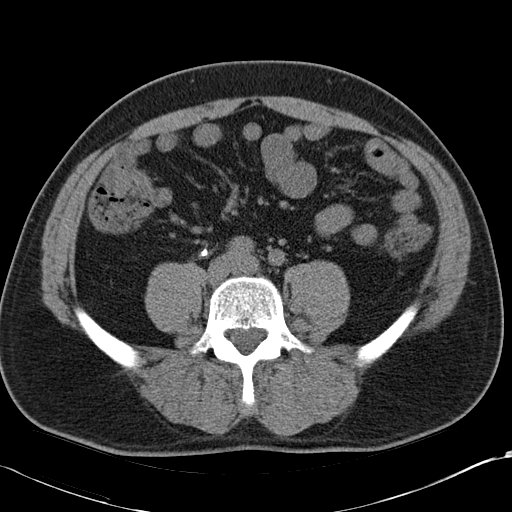
[im 53/102  soft-tissue]
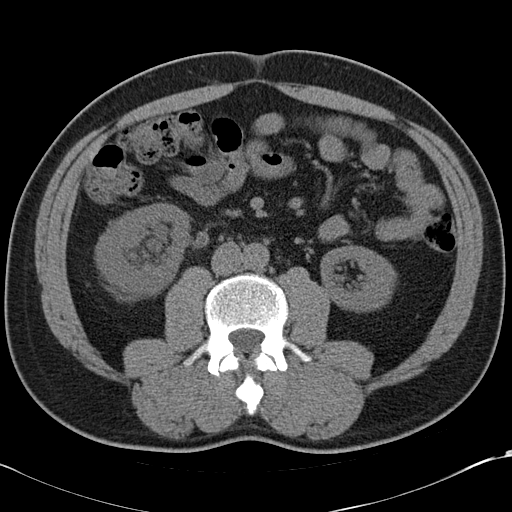
[im 58/102  soft-tissue]
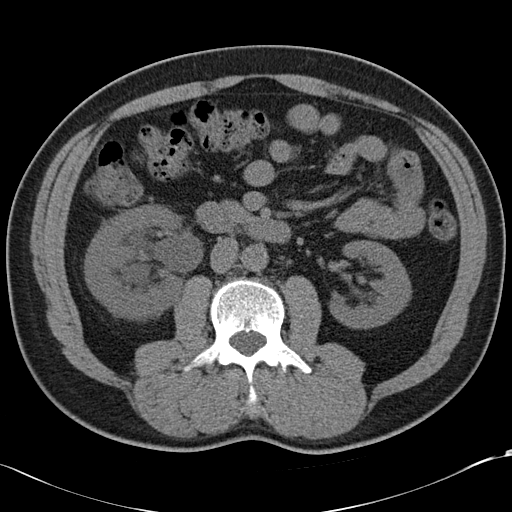
[im 66/102  soft-tissue]
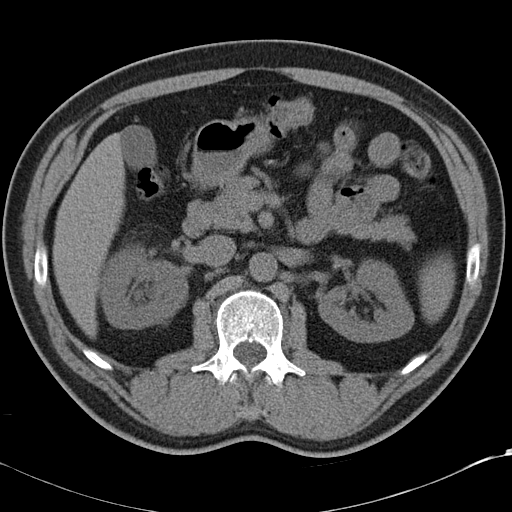
[im 66/102  bone]
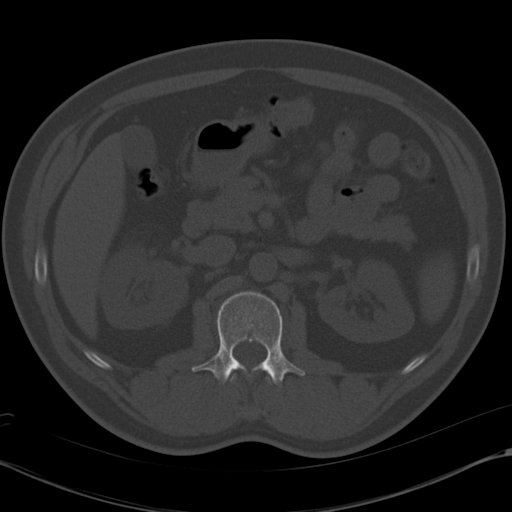
[im 75/102  soft-tissue]
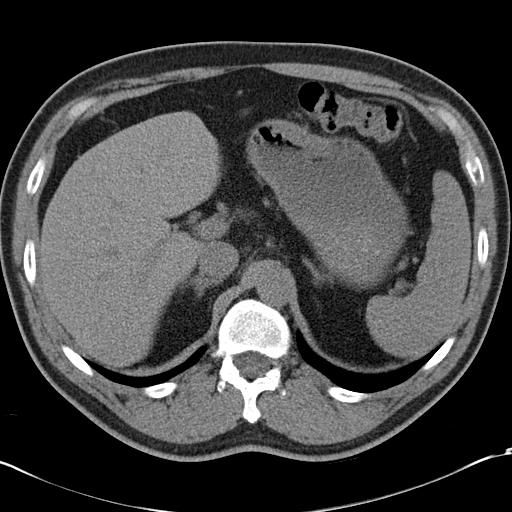
[im 80/102  soft-tissue]
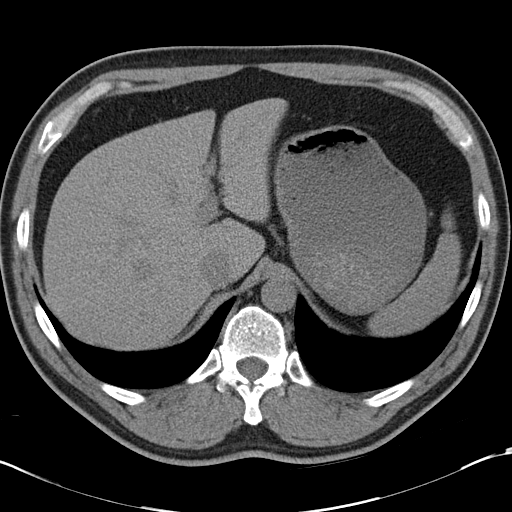
[im 88/102  soft-tissue]
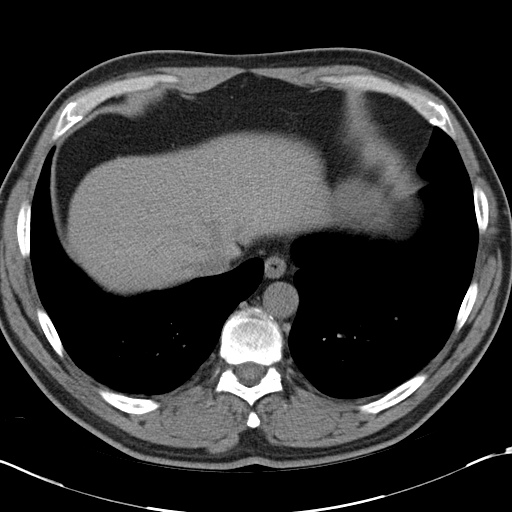
[im 97/102  soft-tissue]
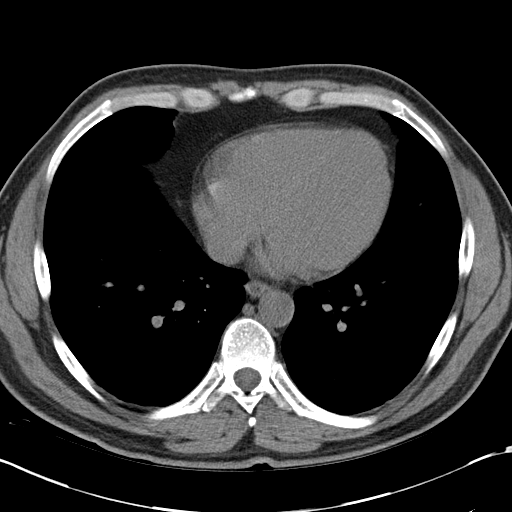

[Series 3: mpr coronal · coronal · 0.79mm/px · 3 of 102 slices shown]
[im 34/102  soft-tissue]
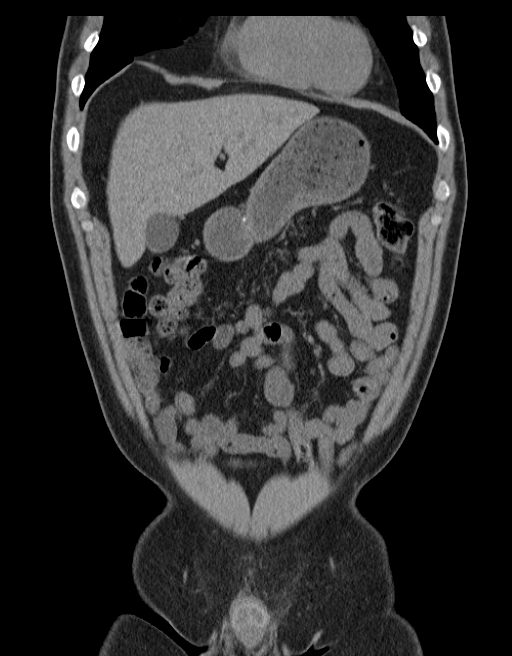
[im 45/102  soft-tissue]
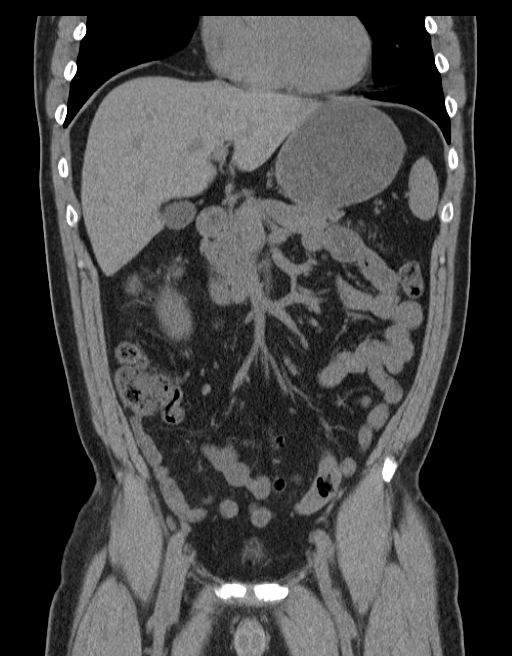
[im 57/102  soft-tissue]
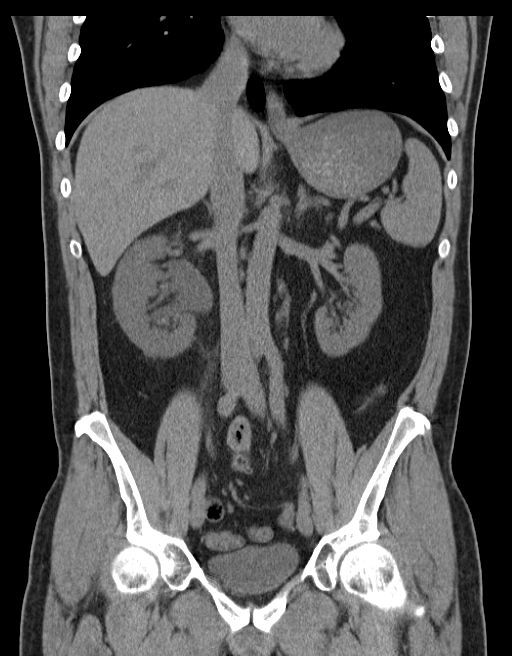

[16 of 46 positions shown; findings below may reference images not displayed]

FINDINGS: Evaluation of this exam is limited in the absence of intravenous
contrast.

The visualized lung bases are clear. No intra-abdominal free air or
free fluid.

The liver, gallbladder, pancreas, spleen, adrenal glands, left
kidney, left ureter appear unremarkable. There is a 5 mm stone in
the proximal right ureter/UPJ with mild right hydronephrosis. A
nonobstructing 7 mm right renal inferior pole calculus is also
noted. There is apparent diffuse thickening of the bladder wall
which may be partly related to underdistention. Cystitis is not
excluded. Correlation with urinalysis recommended. The prostate and
seminal vesicles are grossly unremarkable.

Sigmoid diverticulosis without active inflammatory changes. There is
no evidence of bowel obstruction or inflammation. Normal appendix.

The abdominal aorta and IVC appear grossly unremarkable on this
noncontrast study. No portal venous gas identified. There is no
adenopathy. Small fat containing umbilical hernia. The abdominal
wall soft tissues are otherwise unremarkable. Mild degenerative
changes of the spine. There bilateral L5 pars defects. No acute
fracture.
IMPRESSION: A 5 mm right UPJ stone with mild right hydronephrosis.

## 2019-12-07 ENCOUNTER — Other Ambulatory Visit: Payer: Self-pay

## 2019-12-07 ENCOUNTER — Ambulatory Visit: Payer: 59 | Attending: Internal Medicine

## 2019-12-07 DIAGNOSIS — Z20822 Contact with and (suspected) exposure to covid-19: Secondary | ICD-10-CM

## 2019-12-08 LAB — NOVEL CORONAVIRUS, NAA: SARS-CoV-2, NAA: DETECTED — AB

## 2021-01-27 ENCOUNTER — Other Ambulatory Visit: Payer: Self-pay

## 2021-01-27 ENCOUNTER — Encounter: Payer: Self-pay | Admitting: Emergency Medicine

## 2021-01-27 ENCOUNTER — Ambulatory Visit
Admission: EM | Admit: 2021-01-27 | Discharge: 2021-01-27 | Disposition: A | Discharge status: Home / Self Care | Payer: 59 | Attending: Family Medicine | Admitting: Family Medicine

## 2021-01-27 ENCOUNTER — Ambulatory Visit (INDEPENDENT_AMBULATORY_CARE_PROVIDER_SITE_OTHER): Payer: 59

## 2021-01-27 DIAGNOSIS — R1013 Epigastric pain: Secondary | ICD-10-CM

## 2021-01-27 DIAGNOSIS — R109 Unspecified abdominal pain: Secondary | ICD-10-CM

## 2021-01-27 DIAGNOSIS — K5900 Constipation, unspecified: Secondary | ICD-10-CM

## 2021-01-27 MED ORDER — POLYETHYLENE GLYCOL 3350 17 G PO PACK: 17.0000 g | PACK | Freq: Every day | ORAL | 0 refills | Status: DC

## 2021-01-27 NOTE — Discharge Instructions (Addendum)
Recommend resuming omeprazole for acid reflux.  Your abdominal x-ray shows moderate stool collecting in your colon which is consistent with that of constipation.  Start MiraLAX 1 packet daily over the next 5 days to reduce stool burden. Your EKG is normal today did not indicate any findings consistent with the symptoms you experienced last evening  I have also included the information for you to call a local GI office to schedule an appointment for further work-up and evaluation of GI symptoms.

## 2021-01-27 NOTE — ED Provider Notes (Signed)
[facility]    CSN: [id number] Arrival date & time: 01/27/21  1429      History   Chief Complaint No chief complaint on file.   HPI Kevin Melendez is a 50 y.o. male.   HPI  Patient presents today for evaluation of one episode of mid epigastric pain and sensation of abdominal fullness, that abruptly awakened patient around 2 am this morning. He reports significant discomfort. Symptoms unlike any prior episodes of GERD flares. He denies vomiting, experienced nausea. Burping resolved pain. Symptoms resolved without treatment and has not reoccurred. Patient and wife "googled" symptoms and are concern for possible Gallbladder disease or cardiac source of symptoms. Patient with primary provider and states "I don't do to doctors". Diet over the last 24 hours prior to symptom development include: multiple soft drinks, honey bun, and BBQ sandwich. Reports lab BM possible 3 days ago. Past Medical History:  Diagnosis Date  . Arthritis   . GERD (gastroesophageal reflux disease)   . History of pneumonia   . Renal disorder    kidney stones    There are no problems to display for this patient.   Past Surgical History:  Procedure Laterality Date  . ADENOIDECTOMY    . FOOT SURGERY        Home Medications    Prior to Admission medications   Medication Sig Start Date End Date Taking? Authorizing Provider  ibuprofen (ADVIL,MOTRIN) 200 MG tablet Take 800 mg by mouth as needed. For pain     [provider]  omeprazole (PRILOSEC OTC) 20 MG tablet Take 20 mg by mouth daily.      [provider]  ondansetron (ZOFRAN ODT) 4 MG disintegrating tablet Take 1 tablet (4 mg total) by mouth every 8 (eight) hours as needed for nausea or vomiting. 04/24/16   McKenzie, Mardene Celeste, MD  oxyCODONE-acetaminophen (PERCOCET/ROXICET) 5-325 MG tablet Take 1-2 tablets by mouth every 4 (four) hours as needed for severe pain. 12/03/15   Devoria Albe, MD  tamsulosin (FLOMAX) 0.4 MG CAPS capsule  Take 1 po QD until you pass the stone. 12/03/15   Devoria Albe, MD    Family History History reviewed. No pertinent family history.  Social History Social History   Tobacco Use  . Smoking status: Never Smoker  . Smokeless tobacco: Never Used  Substance Use Topics  . Alcohol use: No  . Drug use: No     Allergies   Patient has no known allergies.   Review of Systems Review of Systems Pertinent negatives listed in HPI Physical Exam Triage Vital Signs ED Triage Vitals  Enc Vitals Group     BP 01/27/21 1438 126/83     Pulse Rate 01/27/21 1438 85     Resp 01/27/21 1438 18     Temp 01/27/21 1438 98.3 F (36.8 C)     Temp Source 01/27/21 1438 Oral     SpO2 01/27/21 1438 95 %     Weight --      Height --      Head Circumference --      Peak Flow --      Pain Score 01/27/21 1440 0     Pain Loc --      Pain Edu? --      Excl. in GC? --    No data found.  Updated Vital Signs BP 126/83 (BP Location: Right Arm)   Pulse 85   Temp 98.3 F (36.8 C) (Oral)   Resp 18  SpO2 95%   Visual Acuity Right Eye Distance:   Left Eye Distance:   Bilateral Distance:    Right Eye Near:   Left Eye Near:    Bilateral Near:     Physical Exam General appearance: alert, well developed, well nourished, cooperative  Head: Normocephalic, without obvious abnormality, atraumatic Respiratory: Respirations even and unlabored, normal respiratory rate Heart: rate and rhythm normal. No gallop or murmurs noted on exam  Abdomen: BS +, no distention, no rebound tenderness, or no mass Extremities: No gross deformities Skin: Skin color, texture, turgor normal. No rashes seen  Psych: Appropriate mood and affect. Neurologic: GCS 15, normal gait, normal coordination   UC Treatments / Results  Labs (all labs ordered are listed, but only abnormal results are displayed) Labs Reviewed - No data to display  EKG NSR, 82 BPM, no ST changes     Radiology DG Abdomen 1 View  Result Date:  01/27/2021 CLINICAL DATA:  Epigastric pressure EXAM: ABDOMEN - 1 VIEW COMPARISON:  April 24, 2016 FINDINGS: Air and stool-filled nondilated loops of bowel. Moderate colonic stool burden diffusely throughout the colon. Pelvic phleboliths. No acute osseous abnormality. IMPRESSION: 1. Nonobstructive bowel gas pattern. 2. Moderate colonic stool burden diffusely throughout the colon. Electronically Signed   By: Meda Klinefelter MD   On: 01/27/2021 15:27    Procedures Procedures (including critical care time)  Medications Ordered in UC Medications - No data to display  Initial Impression / Assessment and Plan / UC Course  I have reviewed the triage vital signs and the nursing notes.  Pertinent labs & imaging results that were available during my care of the patient were reviewed by me and considered in my medical decision making (see chart for details).    Mid epigastric pain, likely stemming from chronic GERD, acid indigestion, and constipation. No history of colonoscopy, given current colon cancer screening guidelines, patient is overdue for screening. Referral placed to GI for evaluation of symptoms.  Resume omeprazole. Start Miralax daily until stool burden is relieved. Red flags discussed warranting immediate evaluation of symptoms.     Final Clinical Impressions(s) / UC Diagnoses   Final diagnoses:  Epigastric pain  Abdominal pressure  Constipation, unspecified constipation type     Discharge Instructions     Recommend resuming omeprazole for acid reflux.  Your abdominal x-ray shows moderate stool collecting in your colon which is consistent with that of constipation.  Start MiraLAX 1 packet daily over the next 5 days to reduce stool burden. Your EKG is normal today did not indicate any findings consistent with the symptoms you experienced last evening  I have also included the information for you to call a local GI office to schedule an appointment for further work-up and evaluation  of GI symptoms.    ED Prescriptions    Medication Sig Dispense Auth. Provider   polyethylene glycol (MIRALAX) 17 g packet Take 17 g by mouth daily. 14 each Bing Neighbors, FNP     PDMP not reviewed this encounter.   Bing Neighbors, FNP 01/28/21 2224

## 2021-01-27 NOTE — ED Triage Notes (Signed)
Upper mid abd pain and pressure.  States he feels like he is full.  Burping makes the pain feel better.

## 2021-01-31 ENCOUNTER — Encounter: Payer: Self-pay | Admitting: Internal Medicine

## 2021-03-11 ENCOUNTER — Encounter: Payer: Self-pay | Admitting: Internal Medicine

## 2021-03-11 ENCOUNTER — Ambulatory Visit: Payer: 59 | Admitting: Gastroenterology

## 2021-08-21 ENCOUNTER — Telehealth: Payer: 59 | Admitting: Physician Assistant

## 2021-08-21 DIAGNOSIS — H1031 Unspecified acute conjunctivitis, right eye: Secondary | ICD-10-CM | POA: Diagnosis not present

## 2021-08-21 MED ORDER — POLYMYXIN B-TRIMETHOPRIM 10000-0.1 UNIT/ML-% OP SOLN: OPHTHALMIC | 0 refills | Status: AC

## 2021-08-21 NOTE — Patient Instructions (Addendum)
  Doyce Loose, thank you for joining [name], PA-C for today's virtual visit.  While this provider is not your primary care provider (PCP), if your PCP is located in our provider database this encounter information will be shared with them immediately following your visit.  Consent: (Patient) Kevin Melendez provided verbal consent for this virtual visit at the beginning of the encounter.  Current Medications:  Current Outpatient Medications:    ibuprofen (ADVIL,MOTRIN) 200 MG tablet, Take 800 mg by mouth as needed. For pain , Disp: , Rfl:    omeprazole (PRILOSEC OTC) 20 MG tablet, Take 20 mg by mouth daily.  , Disp: , Rfl:    ondansetron (ZOFRAN ODT) 4 MG disintegrating tablet, Take 1 tablet (4 mg total) by mouth every 8 (eight) hours as needed for nausea or vomiting., Disp: 20 tablet, Rfl: 0   oxyCODONE-acetaminophen (PERCOCET/ROXICET) 5-325 MG tablet, Take 1-2 tablets by mouth every 4 (four) hours as needed for severe pain., Disp: 25 tablet, Rfl: 0   polyethylene glycol (MIRALAX) 17 g packet, Take 17 g by mouth daily., Disp: 14 each, Rfl: 0   tamsulosin (FLOMAX) 0.4 MG CAPS capsule, Take 1 po QD until you pass the stone., Disp: 10 capsule, Rfl: 0   Medications ordered in this encounter:  No orders of the defined types were placed in this encounter.    *If you need refills on other medications prior to your next appointment, please contact your pharmacy*  Follow-Up: Call back or seek an in-person evaluation if the symptoms worsen or if the condition fails to improve as anticipated.  Other Instructions Please keep hands washed. Apply warm compress to the eye for 10 minutes a few times per day. Use the antibiotic drops as directed until course is complete.  Symptoms should start to improve over next 24-26 hours. If anything continues to worsen you need to be seen in-person. If you note any painful eye movements or vision changes, you need to seek care at nearest  UC/ER   If you have been instructed to have an in-person evaluation today at a local Urgent Care facility, please use the link below. It will take you to a list of all of our available [facility], including address, phone number and hours of operation. Please do not delay care.  [facility]  If you or a family member do not have a primary care provider, use the link below to schedule a visit and establish care. When you choose a [facility] primary care physician or advanced practice provider, you gain a long-term partner in health. Find a Primary Care Provider  Learn more about 's in-office and virtual care options:  - Get Care Now

## 2021-08-21 NOTE — Progress Notes (Signed)
Virtual Visit Consent   [name], you are scheduled for a virtual visit with a [facility] provider today.     Just as with appointments in the office, your consent must be obtained to participate.  Your consent will be active for this visit and any virtual visit you may have with one of our providers in the next 365 days.     If you have a MyChart account, a copy of this consent can be sent to you electronically.  All virtual visits are billed to your insurance company just like a traditional visit in the office.    As this is a virtual visit, video technology does not allow for your provider to perform a traditional examination.  This may limit your provider's ability to fully assess your condition.  If your provider identifies any concerns that need to be evaluated in person or the need to arrange testing (such as labs, EKG, etc.), we will make arrangements to do so.     Although advances in technology are sophisticated, we cannot ensure that it will always work on either your end or our end.  If the connection with a video visit is poor, the visit may have to be switched to a telephone visit.  With either a video or telephone visit, we are not always able to ensure that we have a secure connection.     I need to obtain your verbal consent now.   Are you willing to proceed with your visit today?    CHAE OOMMEN has provided verbal consent on 08/21/2021 for a virtual visit (video or telephone).   Kevin Melendez, [place]   Date: 08/21/2021 6:28 PM   Virtual Visit via Video Note   I, Kevin Melendez, connected with  Kevin Melendez  ([id number], 09/28/1971) on 08/21/21 at  6:15 PM EDT by a video-enabled telemedicine application and verified that I am speaking with the correct person using two identifiers.  Location: Patient: Virtual Visit Location Patient: Home Provider: Virtual Visit Location Provider: Home Office   I discussed the limitations of evaluation and management by  telemedicine and the availability of in person appointments. The patient expressed understanding and agreed to proceed.    History of Present Illness: Kevin Melendez is a 50 y.o. who identifies as a male who was assigned male at birth, and is being seen today for R upper eyelid redness and irritation since Monday.Worsening daily with drainage from the upper eye lid. Denies noted stye. Denies fever, vision changes. Today conjunctiva with redness as well.   HPI: HPI  Problems: There are no problems to display for this patient.   Allergies: No Known Allergies Medications:  Current Outpatient Medications:    trimethoprim-polymyxin b (POLYTRIM) ophthalmic solution, Apply 1-2 drops into affected eye QID x 5 days., Disp: 10 mL, Rfl: 0  Observations/Objective: Patient is well-developed, well-nourished in no acute distress.  Resting comfortably at home.  Head is normocephalic, atraumatic.  No labored breathing. Speech is clear and coherent with logical content.  Patient is alert and oriented at baseline.  R eye with conjunction, worse laterally. Some upper eyelid redness and swelling without noted stye. Pupils equal and round. EOMI.   Assessment and Plan: 1. Acute bacterial conjunctivitis of right eye - trimethoprim-polymyxin b (POLYTRIM) ophthalmic solution; Apply 1-2 drops into affected eye QID x 5 days.  Dispense: 10 mL; Refill: 0 Rx Polytrim OP. Supportive measures and OTC medications reviewed. Strict UC/ER precautions reviewed with patient.  Follow Up Instructions: I discussed the assessment and treatment plan with the patient. The patient was provided an opportunity to ask questions and all were answered. The patient agreed with the plan and demonstrated an understanding of the instructions.  A copy of instructions were sent to the patient via MyChart unless otherwise noted below.   The patient was advised to call back or seek an in-person evaluation if the symptoms worsen or if the  condition fails to improve as anticipated.  Time:  I spent 12 minutes with the patient via telehealth technology discussing the above problems/concerns.    Kevin Climes, PA-C

## 2021-10-24 ENCOUNTER — Telehealth: Payer: 59 | Admitting: Physician Assistant

## 2021-10-24 DIAGNOSIS — J020 Streptococcal pharyngitis: Secondary | ICD-10-CM | POA: Diagnosis not present

## 2021-10-24 MED ORDER — AMOXICILLIN 500 MG PO CAPS: 500.0000 mg | ORAL_CAPSULE | Freq: Two times a day (BID) | ORAL | 0 refills | Status: AC

## 2021-10-24 NOTE — Progress Notes (Signed)
Virtual Visit Consent   Kevin Melendez, you are scheduled for a virtual visit with a [facility] provider today.     Just as with appointments in the office, your consent must be obtained to participate.  Your consent will be active for this visit and any virtual visit you may have with one of our providers in the next 365 days.     If you have a MyChart account, a copy of this consent can be sent to you electronically.  All virtual visits are billed to your insurance company just like a traditional visit in the office.    As this is a virtual visit, video technology does not allow for your provider to perform a traditional examination.  This may limit your provider's ability to fully assess your condition.  If your provider identifies any concerns that need to be evaluated in person or the need to arrange testing (such as labs, EKG, etc.), we will make arrangements to do so.     Although advances in technology are sophisticated, we cannot ensure that it will always work on either your end or our end.  If the connection with a video visit is poor, the visit may have to be switched to a telephone visit.  With either a video or telephone visit, we are not always able to ensure that we have a secure connection.     I need to obtain your verbal consent now.   Are you willing to proceed with your visit today?    Kevin Melendez has provided verbal consent on 10/24/2021 for a virtual visit (video or telephone).   Margaretann Loveless, PA-C   Date: 10/24/2021 4:17 PM   Virtual Visit via Video Note   I, Margaretann Loveless, connected with  Kevin Melendez  ([id number], 08/28/1971) on 10/24/21 at  4:15 PM EST by a video-enabled telemedicine application and verified that I am speaking with the correct person using two identifiers.  Location: Patient: Virtual Visit Location Patient: Home Provider: Virtual Visit Location Provider: Home Office   I discussed the limitations of evaluation and management by  telemedicine and the availability of in person appointments. The patient expressed understanding and agreed to proceed.    History of Present Illness: Kevin Melendez is a 50 y.o. who identifies as a male who was assigned male at birth, and is being seen today for sore throat and ear pain.  HPI: Sore Throat  This is a new problem. The current episode started in the past 7 days. The problem has been gradually worsening. There has been no fever. Associated symptoms include congestion, coughing, ear pain, headaches, a hoarse voice and swollen glands. Pertinent negatives include no ear discharge, plugged ear sensation, shortness of breath or trouble swallowing. He has had no exposure to strep or mono. He has tried nothing for the symptoms. The treatment provided no relief.   Possible strep exposure with wife and daughter  Problems: There are no problems to display for this patient.   Allergies: No Known Allergies Medications:  Current Outpatient Medications:    amoxicillin (AMOXIL) 500 MG capsule, Take 1 capsule (500 mg total) by mouth 2 (two) times daily for 10 days., Disp: 20 capsule, Rfl: 0   trimethoprim-polymyxin b (POLYTRIM) ophthalmic solution, Apply 1-2 drops into affected eye QID x 5 days., Disp: 10 mL, Rfl: 0  Observations/Objective: Patient is well-developed, well-nourished in no acute distress.  Resting comfortably at home.  Head is normocephalic, atraumatic.  No  labored breathing.  Speech is clear and coherent with logical content.  Patient is alert and oriented at baseline.    Assessment and Plan: 1. Strep throat - amoxicillin (AMOXIL) 500 MG capsule; Take 1 capsule (500 mg total) by mouth 2 (two) times daily for 10 days.  Dispense: 20 capsule; Refill: 0  - Will treat as possible strep due to possible exposures - Amoxicillin prescribed - Salt water gargles or chloraseptic spray as needed - OTC medications of choice for symptomatic management - Push fluids - Rest - Seek in  person evaluation if not improving or worsening  Follow Up Instructions: I discussed the assessment and treatment plan with the patient. The patient was provided an opportunity to ask questions and all were answered. The patient agreed with the plan and demonstrated an understanding of the instructions.  A copy of instructions were sent to the patient via MyChart unless otherwise noted below.    The patient was advised to call back or seek an in-person evaluation if the symptoms worsen or if the condition fails to improve as anticipated.  Time:  I spent 9 minutes with the patient via telehealth technology discussing the above problems/concerns.    Margaretann Loveless, PA-C

## 2021-10-24 NOTE — Patient Instructions (Signed)
Doyce Loose, thank you for joining Margaretann Loveless, PA-C for today's virtual visit.  While this provider is not your primary care provider (PCP), if your PCP is located in our provider database this encounter information will be shared with them immediately following your visit.  Consent: (Patient) Kevin Melendez provided verbal consent for this virtual visit at the beginning of the encounter.  Current Medications:  Current Outpatient Medications:    amoxicillin (AMOXIL) 500 MG capsule, Take 1 capsule (500 mg total) by mouth 2 (two) times daily for 10 days., Disp: 20 capsule, Rfl: 0   trimethoprim-polymyxin b (POLYTRIM) ophthalmic solution, Apply 1-2 drops into affected eye QID x 5 days., Disp: 10 mL, Rfl: 0   Medications ordered in this encounter:  Meds ordered this encounter  Medications   amoxicillin (AMOXIL) 500 MG capsule    Sig: Take 1 capsule (500 mg total) by mouth 2 (two) times daily for 10 days.    Dispense:  20 capsule    Refill:  0    Order Specific Question:   Supervising Provider    Answer:   Hyacinth Meeker, BRIAN [[id number]]     *If you need refills on other medications prior to your next appointment, please contact your pharmacy*  Follow-Up: Call back or seek an in-person evaluation if the symptoms worsen or if the condition fails to improve as anticipated.  Other Instructions Strep Throat, Adult Strep throat is an infection of the throat. It is caused by germs (bacteria). Strep throat is common during the cold months of the year. It mostly affects children who are 39-66 years old. However, people of all ages can get it at any time of the year. This infection spreads from person to person through coughing, sneezing, or having close contact. What are the causes? This condition is caused by the Streptococcus pyogenes germ. What increases the risk? You care for young children. Children are more likely to get strep throat and may spread it to others. You go to crowded places.  Germs can spread easily in such places. You kiss or touch someone who has strep throat. What are the signs or symptoms? Fever or chills. Redness, swelling, or pain in the tonsils or throat. Pain or trouble when swallowing. White or yellow spots on the tonsils or throat. Tender glands in the neck and under the jaw. Bad breath. Red rash all over the body. This is rare. How is this treated? Medicines that kill germs (antibiotics). Medicines that treat pain or fever. These include: Ibuprofen or acetaminophen. Aspirin, only for people who are over the age of 61. Cough drops. Throat sprays. Follow these instructions at home: Medicines  Take over-the-counter and prescription medicines only as told by your doctor. Take your antibiotic medicine as told by your doctor. Do not stop taking the antibiotic even if you start to feel better. Eating and drinking  If you have trouble swallowing, eat soft foods until your throat feels better. Drink enough fluid to keep your pee (urine) pale yellow. To help with pain, you may have: Warm fluids, such as soup and tea. Cold fluids, such as frozen desserts or popsicles. General instructions Rinse your mouth (gargle) with a salt-water mixture 3-4 times a day or as needed. To make a salt-water mixture, dissolve -1 tsp (3-6 g) of salt in 1 cup (237 mL) of warm water. Rest as much as you can. Stay home from work or school until you have been taking antibiotics for 24 hours. Do not smoke  or use any products that contain nicotine or tobacco. If you need help quitting, ask your doctor. Keep all follow-up visits. How is this prevented?  Do not share food, drinking cups, or personal items. They can cause the germs to spread. Wash your hands well with soap and water. Make sure that all people in your house wash their hands well. Have family members tested if they have a fever or a sore throat. They may need an antibiotic if they have strep throat. Contact a  doctor if: You have swelling in your neck that keeps getting bigger. You get a rash, cough, or earache. You cough up a thick fluid that is green, yellow-brown, or bloody. You have pain that does not get better with medicine. Your symptoms get worse instead of getting better. You have a fever. Get help right away if: You vomit. You have a very bad headache. Your neck hurts or feels stiff. You have chest pain or are short of breath. You have drooling, very bad throat pain, or changes in your voice. Your neck is swollen, or the skin gets red and tender. Your mouth is dry, or you are peeing less than normal. You keep feeling more tired or have trouble waking up. Your joints are red or painful. These symptoms may be an emergency. Do not wait to see if the symptoms will go away. Get help right away. Call your local emergency services (911 in the [place] Summary Strep throat is an infection of the throat. It is caused by germs (bacteria). This infection can spread from person to person through coughing, sneezing, or having close contact. Take your medicines, including antibiotics, as told by your doctor. Do not stop taking the antibiotic even if you start to feel better. To prevent the spread of germs, wash your hands well with soap and water. Have others do the same. Do not share food, drinking cups, or personal items. Get help right away if you have a bad headache, chest pain, shortness of breath, a stiff or painful neck, or you vomit. This information is not intended to replace advice given to you by your health care provider. Make sure you discuss any questions you have with your health care provider. Document Revised: 03/05/2021 Document Reviewed: 03/05/2021 Elsevier Patient Education  2022 [employer].    If you have been instructed to have an in-person evaluation today at a local Urgent Care facility, please use the link below. It will take you to a list of all of our available [facility], including address, phone number and hours of operation. Please do not delay care.  [facility]  If you or a family member do not have a primary care provider, use the link below to schedule a visit and establish care. When you choose a [facility] primary care physician or advanced practice provider, you gain a long-term partner in health. Find a Primary Care Provider  Learn more about [facility]'s in-office and virtual care options: [facility] - Get Care Now

## 2024-03-14 ENCOUNTER — Telehealth: Admitting: Nurse Practitioner

## 2024-03-14 DIAGNOSIS — J0140 Acute pansinusitis, unspecified: Secondary | ICD-10-CM

## 2024-03-14 DIAGNOSIS — H60392 Other infective otitis externa, left ear: Secondary | ICD-10-CM | POA: Diagnosis not present

## 2024-03-14 MED ORDER — CIPROFLOXACIN-DEXAMETHASONE 0.3-0.1 % OT SUSP: 4.0000 [drp] | Freq: Two times a day (BID) | OTIC | 0 refills | Status: AC

## 2024-03-14 MED ORDER — DOXYCYCLINE HYCLATE 100 MG PO TABS: 100.0000 mg | ORAL_TABLET | Freq: Two times a day (BID) | ORAL | 0 refills | Status: AC

## 2024-03-14 NOTE — Progress Notes (Signed)
 Virtual Visit Consent   [name], you are scheduled for a virtual visit with a [facility] provider today. Just as with appointments in the office, your consent must be obtained to participate. Your consent will be active for this visit and any virtual visit you may have with one of our providers in the next 365 days. If you have a MyChart account, a copy of this consent can be sent to you electronically.  As this is a virtual visit, video technology does not allow for your provider to perform a traditional examination. This may limit your provider's ability to fully assess your condition. If your provider identifies any concerns that need to be evaluated in person or the need to arrange testing (such as labs, EKG, etc.), we will make arrangements to do so. Although advances in technology are sophisticated, we cannot ensure that it will always work on either your end or our end. If the connection with a video visit is poor, the visit may have to be switched to a telephone visit. With either a video or telephone visit, we are not always able to ensure that we have a secure connection.  By engaging in this virtual visit, you consent to the provision of healthcare and authorize for your insurance to be billed (if applicable) for the services provided during this visit. Depending on your insurance coverage, you may receive a charge related to this service.  I need to obtain your verbal consent now. Are you willing to proceed with your visit today? DILON LANK has provided verbal consent on 03/14/2024 for a virtual visit (video or telephone). Mardene Shake, FNP  Date: 03/14/2024 7:15 PM   Virtual Visit via Video Note   I, Mardene Shake, connected with  Kevin Melendez  ([id number], 10-06-1971) on 03/14/24 at  7:15 PM EDT by a video-enabled telemedicine application and verified that I am speaking with the correct person using two identifiers.  Location: Patient: Virtual Visit Location Patient:  Home Provider: Virtual Visit Location Provider: Home Office   I discussed the limitations of evaluation and management by telemedicine and the availability of in person appointments. The patient expressed understanding and agreed to proceed.    History of Present Illness: Kevin Melendez is a 53 y.o. who identifies as a male who was assigned male at birth, and is being seen today for left ear pain and swelling   He was sick 2 weeks ago with sinus congestion and body aches  Has has a little residual congestion mostly in his chest   Woke up over the weekend with some swelling and tenderness to his left ear   Denies any swimming   His wife says she can see swelling in his canal and the ear is tender to touch  Ibuprofen  has been helping some     Problems: There are no active problems to display for this patient.   Allergies: No Known Allergies Medications:  Current Outpatient Medications:    trimethoprim -polymyxin b  (POLYTRIM ) ophthalmic solution, Apply 1-2 drops into affected eye QID x 5 days., Disp: 10 mL, Rfl: 0  Observations/Objective: Patient is well-developed, well-nourished in no acute distress.  Resting comfortably  at home.  Head is normocephalic, atraumatic.  No labored breathing.  Speech is clear and coherent with logical content.  Patient is alert and oriented at baseline.    Assessment and Plan:  1. Other infective acute otitis externa of left ear  2. Acute non-recurrent pansinusitis  Meds ordered this encounter  Medications   ciprofloxacin -dexamethasone  (CIPRODEX ) OTIC suspension    Sig: Place 4 drops into the left ear 2 (two) times daily for 7 days.    Dispense:  7.5 mL    Refill:  0   doxycycline  (VIBRA -TABS) 100 MG tablet    Sig: Take 1 tablet (100 mg total) by mouth 2 (two) times daily for 7 days.    Dispense:  14 tablet    Refill:  0    Follow Up Instructions: I discussed the assessment and treatment plan with the patient. The patient was provided  an opportunity to ask questions and all were answered. The patient agreed with the plan and demonstrated an understanding of the instructions.  A copy of instructions were sent to the patient via MyChart unless otherwise noted below.    The patient was advised to call back or seek an in-person evaluation if the symptoms worsen or if the condition fails to improve as anticipated.    Mardene Shake, FNP
